# Patient Record
Sex: Male | Born: 1990 | Race: Black or African American | Hispanic: No | Marital: Single | Smoking: Current every day smoker
Health system: Southern US, Community
[De-identification: ages and names within clinical notes are randomized; demographics above are authoritative.]

---
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
## 2010-08-10 ENCOUNTER — Emergency Department (HOSPITAL_COMMUNITY): Payer: Self-pay

## 2010-08-10 ENCOUNTER — Emergency Department (HOSPITAL_COMMUNITY)
Admission: EM | Admit: 2010-08-10 | Discharge: 2010-08-11 | Disposition: A | Discharge status: Home / Self Care | Payer: Self-pay | Attending: Emergency Medicine | Admitting: Emergency Medicine

## 2010-08-10 DIAGNOSIS — M2180 Other specified acquired deformities of unspecified limb: Secondary | ICD-10-CM | POA: Insufficient documentation

## 2010-08-10 DIAGNOSIS — M24419 Recurrent dislocation, unspecified shoulder: Secondary | ICD-10-CM | POA: Insufficient documentation

## 2010-08-10 DIAGNOSIS — R000 Tachycardia, unspecified: Secondary | ICD-10-CM | POA: Insufficient documentation

## 2010-08-10 DIAGNOSIS — M25519 Pain in unspecified shoulder: Secondary | ICD-10-CM | POA: Insufficient documentation

## 2010-08-11 ENCOUNTER — Emergency Department (HOSPITAL_COMMUNITY): Payer: Self-pay

## 2010-09-15 LAB — CBC
HCT: 45.2 % (ref 39.0–52.0)
Hemoglobin: 14.2 g/dL (ref 13.0–17.0)
MCHC: 31.5 g/dL (ref 30.0–36.0)
MCV: 76.9 fL — ABNORMAL LOW (ref 78.0–100.0)
Platelets: 214 K/uL (ref 150–400)
RBC: 5.88 MIL/uL — ABNORMAL HIGH (ref 4.22–5.81)
RDW: 14.3 % (ref 11.5–15.5)
WBC: 8.5 K/uL (ref 4.0–10.5)

## 2010-09-15 LAB — DIFFERENTIAL
Band Neutrophils: 0 % (ref 0–10)
Basophils Absolute: 0 K/uL (ref 0.0–0.1)
Basophils Relative: 0 % (ref 0–1)
Blasts: 0 %
Eosinophils Absolute: 0.1 K/uL (ref 0.0–0.7)
Eosinophils Relative: 1 % (ref 0–5)
Lymphocytes Relative: 13 % (ref 12–46)
Lymphs Abs: 1.1 K/uL (ref 0.7–4.0)
Metamyelocytes Relative: 0 %
Monocytes Absolute: 0.2 K/uL (ref 0.1–1.0)
Monocytes Relative: 2 % — ABNORMAL LOW (ref 3–12)
Myelocytes: 0 %
Neutro Abs: 7.1 K/uL (ref 1.7–7.7)
Neutrophils Relative %: 84 % — ABNORMAL HIGH (ref 43–77)
Promyelocytes Absolute: 0 %
nRBC: 0 /100{WBCs}

## 2010-09-15 LAB — POCT I-STAT, CHEM 8
BUN: 15 mg/dL (ref 6–23)
Calcium, Ion: 1.19 mmol/L (ref 1.12–1.32)
Creatinine, Ser: 1 mg/dL (ref 0.4–1.5)
Glucose, Bld: 88 mg/dL (ref 70–99)
TCO2: 27 mmol/L (ref 0–100)

## 2014-01-07 ENCOUNTER — Encounter (HOSPITAL_COMMUNITY): Payer: Self-pay | Admitting: Emergency Medicine

## 2014-01-07 ENCOUNTER — Emergency Department (HOSPITAL_COMMUNITY)
Admission: EM | Admit: 2014-01-07 | Discharge: 2014-01-07 | Disposition: A | Discharge status: Home / Self Care | Payer: Self-pay | Attending: Emergency Medicine | Admitting: Emergency Medicine

## 2014-01-07 ENCOUNTER — Emergency Department (HOSPITAL_COMMUNITY): Payer: Self-pay

## 2014-01-07 DIAGNOSIS — S8001XA Contusion of right knee, initial encounter: Secondary | ICD-10-CM

## 2014-01-07 DIAGNOSIS — S0180XA Unspecified open wound of other part of head, initial encounter: Secondary | ICD-10-CM | POA: Insufficient documentation

## 2014-01-07 DIAGNOSIS — Y929 Unspecified place or not applicable: Secondary | ICD-10-CM | POA: Insufficient documentation

## 2014-01-07 DIAGNOSIS — Z791 Long term (current) use of non-steroidal anti-inflammatories (NSAID): Secondary | ICD-10-CM | POA: Insufficient documentation

## 2014-01-07 DIAGNOSIS — Y939 Activity, unspecified: Secondary | ICD-10-CM | POA: Insufficient documentation

## 2014-01-07 DIAGNOSIS — W503XXA Accidental bite by another person, initial encounter: Secondary | ICD-10-CM | POA: Insufficient documentation

## 2014-01-07 DIAGNOSIS — F17200 Nicotine dependence, unspecified, uncomplicated: Secondary | ICD-10-CM | POA: Insufficient documentation

## 2014-01-07 DIAGNOSIS — Z23 Encounter for immunization: Secondary | ICD-10-CM | POA: Insufficient documentation

## 2014-01-07 DIAGNOSIS — Z792 Long term (current) use of antibiotics: Secondary | ICD-10-CM | POA: Insufficient documentation

## 2014-01-07 DIAGNOSIS — S8000XA Contusion of unspecified knee, initial encounter: Secondary | ICD-10-CM | POA: Insufficient documentation

## 2014-01-07 MED ORDER — HYDROCODONE-ACETAMINOPHEN 5-325 MG PO TABS
1.0000 | ORAL_TABLET | Freq: Once | ORAL | Status: AC
  Administered 2014-01-07: 1 via ORAL
  Filled 2014-01-07: qty 1

## 2014-01-07 MED ORDER — TETANUS-DIPHTH-ACELL PERTUSSIS 5-2.5-18.5 LF-MCG/0.5 IM SUSP
0.5000 mL | Freq: Once | INTRAMUSCULAR | Status: AC
  Administered 2014-01-07: 0.5 mL via INTRAMUSCULAR
  Filled 2014-01-07: qty 0.5

## 2014-01-07 MED ORDER — AMOXICILLIN-POT CLAVULANATE 875-125 MG PO TABS
1.0000 | ORAL_TABLET | Freq: Once | ORAL | Status: AC
  Administered 2014-01-07: 1 via ORAL
  Filled 2014-01-07: qty 1

## 2014-01-07 MED ORDER — IBUPROFEN 200 MG PO TABS
600.0000 mg | ORAL_TABLET | Freq: Once | ORAL | Status: AC
  Administered 2014-01-07: 600 mg via ORAL
  Filled 2014-01-07: qty 3

## 2014-01-07 MED ORDER — IBUPROFEN 600 MG PO TABS: 600.0000 mg | ORAL_TABLET | Freq: Four times a day (QID) | ORAL | Status: DC

## 2014-01-07 MED ORDER — AMOXICILLIN-POT CLAVULANATE 875-125 MG PO TABS: 1.0000 | ORAL_TABLET | Freq: Two times a day (BID) | ORAL | Status: DC

## 2014-01-07 MED ORDER — MUPIROCIN CALCIUM 2 % EX CREA
TOPICAL_CREAM | Freq: Once | CUTANEOUS | Status: AC
  Administered 2014-01-07: 1 via TOPICAL
  Filled 2014-01-07: qty 15

## 2014-01-07 MED ORDER — HYDROCODONE-ACETAMINOPHEN 5-325 MG PO TABS: 1.0000 | ORAL_TABLET | Freq: Four times a day (QID) | ORAL | Status: AC | PRN
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
## 2014-01-07 NOTE — ED Provider Notes (Signed)
Medical screening examination/treatment/procedure(s) were performed by non-physician practitioner and as supervising physician I was immediately available for consultation/collaboration.   EKG Interpretation None        Vondra Aldredge, MD 01/07/14 0651 

## 2014-01-07 NOTE — ED Provider Notes (Signed)
Medical screening examination/treatment/procedure(s) were performed by non-physician practitioner and as supervising physician I was immediately available for consultation/collaboration.   EKG Interpretation None        Loren Raceravid Aunna Snooks, MD 01/07/14 [phone]
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
## 2014-01-07 NOTE — Discharge Instructions (Signed)
Make sure to take the antibiotic until all the tablets have been consumed  Return for any sign of infection such as pus,  redness, increased swelling, fever

## 2015-12-15 ENCOUNTER — Emergency Department (HOSPITAL_COMMUNITY)
Admission: EM | Admit: 2015-12-15 | Discharge: 2015-12-15 | Disposition: A | Discharge status: Home / Self Care | Payer: Self-pay | Attending: Emergency Medicine | Admitting: Emergency Medicine

## 2015-12-15 ENCOUNTER — Encounter (HOSPITAL_COMMUNITY): Payer: Self-pay | Admitting: Emergency Medicine

## 2015-12-15 DIAGNOSIS — L509 Urticaria, unspecified: Secondary | ICD-10-CM

## 2015-12-15 DIAGNOSIS — F17210 Nicotine dependence, cigarettes, uncomplicated: Secondary | ICD-10-CM | POA: Insufficient documentation

## 2015-12-15 DIAGNOSIS — L500 Allergic urticaria: Secondary | ICD-10-CM | POA: Insufficient documentation

## 2015-12-15 MED ORDER — HYDROCORTISONE 1 % EX CREA
TOPICAL_CREAM | Freq: Once | CUTANEOUS | Status: AC
  Administered 2015-12-15: 1 via TOPICAL
  Filled 2015-12-15: qty 28

## 2015-12-15 MED ORDER — DIPHENHYDRAMINE HCL 25 MG PO CAPS
50.0000 mg | ORAL_CAPSULE | Freq: Once | ORAL | Status: AC
  Administered 2015-12-15: 50 mg via ORAL
  Filled 2015-12-15: qty 2

## 2015-12-15 NOTE — Discharge Instructions (Signed)
Contact Dermatitis Dylan Lynch, use steroid cream 3x per day as needed for itching.  Take benadryl for severe itching.  See a primary care doctor within 3 days for close follow up. If symptoms worsen, come back to the ED immediately. Thank you. Dermatitis is redness, soreness, and swelling (inflammation) of the skin. Contact dermatitis is a reaction to certain substances that touch the skin. You either touched something that irritated your skin, or you have allergies to something you touched.  HOME CARE  Skin Care  Moisturize your skin as needed.  Apply cool compresses to the affected areas.   Try taking a bath with:   Epsom salts. Follow the instructions on the package. You can get these at a pharmacy or grocery store.   Baking soda. Pour a small amount into the bath as told by your doctor.   Colloidal oatmeal. Follow the instructions on the package. You can get this at a pharmacy or grocery store.   Try applying baking soda paste to your skin. Stir water into baking soda until it looks like paste.  Do not scratch your skin.   Bathe less often.  Bathe in lukewarm water. Avoid using hot water.  Medicines  Take or apply over-the-counter and prescription medicines only as told by your doctor.   If you were prescribed an antibiotic medicine, take or apply your antibiotic as told by your doctor. Do not stop taking the antibiotic even if your condition starts to get better. General Instructions  Keep all follow-up visits as told by your doctor. This is important.   Avoid the substance that caused your reaction. If you do not know what caused it, keep a journal to try to track what caused it. Write down:   What you eat.   What cosmetic products you use.   What you drink.   What you wear in the affected area. This includes jewelry.   If you were given a bandage (dressing), take care of it as told by your doctor. This includes when to change and remove it.  GET  HELP IF:   You do not get better with treatment.   Your condition gets worse.   You have signs of infection such as:  Swelling.  Tenderness.  Redness.  Soreness.  Warmth.   You have a fever.   You have new symptoms.  GET HELP RIGHT AWAY IF:   You have a very bad headache.  You have neck pain.  Your neck is stiff.   You throw up (vomit).   You feel very sleepy.   You see red streaks coming from the affected area.   Your bone or joint underneath the affected area becomes painful after the skin has healed.   The affected area turns darker.   You have trouble breathing.    This information is not intended to replace advice given to you by your health care provider. Make sure you discuss any questions you have with your health care provider.   Document Released: 03/15/2009 Document Revised: 02/06/2015 Document Reviewed: 10/03/2014 Elsevier Interactive Patient Education [employer]

## 2015-12-15 NOTE — ED Notes (Signed)
Pt states he noticed that he has some bug bites on bilateral arms he thought it was mosquito bites, but today he noticed that is spread all around his arms.
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
## 2016-12-26 ENCOUNTER — Encounter (HOSPITAL_COMMUNITY): Payer: Self-pay

## 2016-12-26 ENCOUNTER — Emergency Department (HOSPITAL_COMMUNITY)
Admission: EM | Admit: 2016-12-26 | Discharge: 2016-12-26 | Disposition: A | Discharge status: Home / Self Care | Payer: Self-pay | Attending: Emergency Medicine | Admitting: Emergency Medicine

## 2016-12-26 DIAGNOSIS — R21 Rash and other nonspecific skin eruption: Secondary | ICD-10-CM | POA: Insufficient documentation

## 2016-12-26 DIAGNOSIS — Z79899 Other long term (current) drug therapy: Secondary | ICD-10-CM | POA: Insufficient documentation

## 2016-12-26 DIAGNOSIS — F17210 Nicotine dependence, cigarettes, uncomplicated: Secondary | ICD-10-CM | POA: Insufficient documentation

## 2016-12-26 MED ORDER — TRIAMCINOLONE ACETONIDE 0.1 % EX CREA: 1.0000 "application " | TOPICAL_CREAM | Freq: Two times a day (BID) | CUTANEOUS | 0 refills | Status: AC

## 2016-12-26 NOTE — Discharge Instructions (Addendum)
Use steroid cream twice daily

## 2016-12-26 NOTE — ED Provider Notes (Signed)
[facility]-EMERGENCY DEPT Provider Note   CSN: [id number] Arrival date & time: 12/26/16  1417   By signing my name below, I, Clarisse GougeXavier Herndon, attest that this documentation has been prepared under the direction and in the presence of Terance HartKelly Gekas, PA-C. Electronically signed, Clarisse GougeXavier Herndon, ED Scribe. 12/26/16. 4:45 PM.  History   Chief Complaint Chief Complaint  Patient presents with  . Insect Bite    The history is provided by the patient and medical records. The history is limited by the condition of the patient. No language interpreter was used.    Dylan Lynch is a 26 y.o. male presenting to the Emergency Department concerning multiple quarter sized, red plaques to the R upper arm and R chest. Associated itchiness. Pt believes this is from a bug bite though he did not visualize one. No sick contacts. No other complaints at this time.   LEVEL 5 CAVEAT: HPI and ROS limited due to extreme drowsiness    History reviewed. No pertinent past medical history.  There are no active problems to display for this patient.   History reviewed. No pertinent surgical history.     Home Medications    Prior to Admission medications   Medication Sig Start Date End Date Taking? Authorizing Provider  amoxicillin-clavulanate (AUGMENTIN) 875-125 MG per tablet Take 1 tablet by mouth 2 (two) times daily. 01/07/14   Earley FavorSchulz, Gail, NP  HYDROcodone-acetaminophen (NORCO/VICODIN) 5-325 MG per tablet Take 1 tablet by mouth every 6 (six) hours as needed for moderate pain. 01/07/14   Earley FavorSchulz, Gail, NP  ibuprofen (ADVIL,MOTRIN) 600 MG tablet Take 1 tablet (600 mg total) by mouth 4 (four) times daily. 01/07/14   Earley FavorSchulz, Gail, NP    Family History No family history on file.  Social History Social History  Substance Use Topics  . Smoking status: Current Every Day Smoker    Packs/day: 1.00    Types: Cigarettes  . Smokeless tobacco: Never Used  . Alcohol use Yes     Allergies   Patient has no known  allergies.   Review of Systems Review of Systems  Unable to perform ROS: Other     Physical Exam Updated Vital Signs BP 103/66 (BP Location: Left Arm)   Pulse 99   Temp 97.7 F (36.5 C) (Oral)   Resp 14   Ht 5\' 11"  (1.803 m)   Wt 180 lb (81.6 kg)   SpO2 95%   BMI 25.10 kg/m   Physical Exam  Constitutional: Vital signs are normal. He appears well-developed and well-nourished. He is sleeping. No distress.  Pt somnolent and difficult to arouse  HENT:  Head: Normocephalic and atraumatic.  Neck: Normal range of motion.  Cardiovascular: Normal rate.   Pulmonary/Chest: Effort normal. No respiratory distress.  Abdominal: He exhibits no distension.  Musculoskeletal: Normal range of motion.  Skin: Skin is warm and dry.  Several quarter sized erythematous raised plaques to the R upper arm and R upper chest wall  Psychiatric: He has a normal mood and affect. His behavior is normal.  Nursing note and vitals reviewed.    ED Treatments / Results  DIAGNOSTIC STUDIES: Oxygen Saturation is 95% on RA, adequate by my interpretation.    COORDINATION OF CARE: 4:44 PM-Discussed next steps with pt. Pt verbalized understanding and is agreeable with the plan. Will Rx medications. Pt prepared for d/c, advised of symptomatic care at home and return precautions.    Labs (all labs ordered are listed, but only abnormal results are displayed) Labs Reviewed -  No data to display  EKG  EKG Interpretation None       Radiology No results found.  Procedures Procedures (including critical care time)  Medications Ordered in ED Medications - No data to display   Initial Impression / Assessment and Plan / ED Course  I have reviewed the triage vital signs and the nursing notes.  Pertinent labs & imaging results that were available during my care of the patient were reviewed by me and considered in my medical decision making (see chart for details).  26 year old male with possible insect  bites. He is very sleepy and I was unable to get a adequate history from him. Advised steroid cream twice a day. Security was called to arouse pt and escort him from the ED.  Final Clinical Impressions(s) / ED Diagnoses   Final diagnoses:  Rash and nonspecific skin eruption    New Prescriptions New Prescriptions   No medications on file   I personally performed the services described in this documentation, which was scribed in my presence. The recorded information has been reviewed and is accurate.    Bethel BornGekas, Kelly Marie, PA-C 12/26/16 1743    Margarita Grizzleay, Danielle, MD 12/27/16 [phone]

## 2016-12-26 NOTE — ED Triage Notes (Signed)
Pt refused to open eyes and talk with PA,the registration staff and RN. Security called to escort Pt from the room 26 to front lobby. Pt A/O and ambulatory with security.

## 2016-12-26 NOTE — ED Notes (Signed)
Declined W/C at D/C and was escorted to lobby by RN. 

## 2016-12-26 NOTE — ED Triage Notes (Signed)
Per Pt, Pt is coming from home with insect bites noted to his right and left arms that he noticed last night.

## 2017-01-04 ENCOUNTER — Emergency Department (HOSPITAL_COMMUNITY): Payer: Self-pay

## 2017-01-04 ENCOUNTER — Emergency Department (HOSPITAL_COMMUNITY)
Admission: EM | Admit: 2017-01-04 | Discharge: 2017-01-04 | Disposition: A | Discharge status: Home / Self Care | Payer: Self-pay | Attending: Emergency Medicine | Admitting: Emergency Medicine

## 2017-01-04 ENCOUNTER — Encounter (HOSPITAL_COMMUNITY): Payer: Self-pay | Admitting: Emergency Medicine

## 2017-01-04 DIAGNOSIS — F17210 Nicotine dependence, cigarettes, uncomplicated: Secondary | ICD-10-CM | POA: Insufficient documentation

## 2017-01-04 DIAGNOSIS — Y9372 Activity, wrestling: Secondary | ICD-10-CM | POA: Insufficient documentation

## 2017-01-04 DIAGNOSIS — Y929 Unspecified place or not applicable: Secondary | ICD-10-CM | POA: Insufficient documentation

## 2017-01-04 DIAGNOSIS — S43004A Unspecified dislocation of right shoulder joint, initial encounter: Secondary | ICD-10-CM | POA: Insufficient documentation

## 2017-01-04 DIAGNOSIS — X509XXA Other and unspecified overexertion or strenuous movements or postures, initial encounter: Secondary | ICD-10-CM | POA: Insufficient documentation

## 2017-01-04 DIAGNOSIS — Y999 Unspecified external cause status: Secondary | ICD-10-CM | POA: Insufficient documentation

## 2017-01-04 DIAGNOSIS — Z79899 Other long term (current) drug therapy: Secondary | ICD-10-CM | POA: Insufficient documentation

## 2017-01-04 MED ORDER — IBUPROFEN 600 MG PO TABS: 600.0000 mg | ORAL_TABLET | Freq: Four times a day (QID) | ORAL | 0 refills | Status: AC

## 2017-01-04 MED ORDER — PROPOFOL 10 MG/ML IV BOLUS: 0.5000 mg/kg | Freq: Once | INTRAVENOUS | Status: DC

## 2017-01-04 MED ORDER — SODIUM CHLORIDE 0.9 % IV SOLN: Freq: Once | INTRAVENOUS | Status: DC

## 2017-01-04 NOTE — ED Provider Notes (Signed)
[facility] Provider Note   CSN: [id number] Arrival date & time: 01/04/17  0142    History   Chief Complaint Chief Complaint  Patient presents with  . Shoulder Pain    possible dislocation right arm    HPI Dylan Lynch is a 26 y.o. male.  26 year old male with a history of right shoulder dislocation presents to the emergency department for shoulder pain. He states that he was wrestling when "I swung my arm too hard and I knew it was out". Patient reports a history of spontaneous shoulder reduction. He has been unable to reduce his shoulder prior to arrival. He reports severe pain which is aggravated with movement. No medications taken prior to arrival for symptoms. Patient denies associated numbness or paresthesias.      History reviewed. No pertinent past medical history.  There are no active problems to display for this patient.   History reviewed. No pertinent surgical history.    Home Medications    Prior to Admission medications   Medication Sig Start Date End Date Taking? Authorizing Provider  amoxicillin-clavulanate (AUGMENTIN) 875-125 MG per tablet Take 1 tablet by mouth 2 (two) times daily. 01/07/14   Earley FavorSchulz, Gail, NP  HYDROcodone-acetaminophen (NORCO/VICODIN) 5-325 MG per tablet Take 1 tablet by mouth every 6 (six) hours as needed for moderate pain. 01/07/14   Earley FavorSchulz, Gail, NP  ibuprofen (ADVIL,MOTRIN) 600 MG tablet Take 1 tablet (600 mg total) by mouth 4 (four) times daily. 01/04/17   Antony MaduraHumes, Mardi Cannady, PA-C  triamcinolone cream (KENALOG) 0.1 % Apply 1 application topically 2 (two) times daily. 12/26/16   Bethel BornGekas, Anabel Lykins Marie, PA-C    Family History No family history on file.  Social History Social History  Substance Use Topics  . Smoking status: Current Every Day Smoker    Packs/day: 1.00    Types: Cigarettes  . Smokeless tobacco: Never Used  . Alcohol use Yes     Allergies   Patient has no known allergies.   Review of Systems Review of  Systems Ten systems reviewed and are negative for acute change, except as noted in the HPI.    Physical Exam Updated Vital Signs BP 129/77 (BP Location: Right Arm)   Pulse 93   Temp 97.8 F (36.6 C) (Oral)   Resp 18   Ht 5\' 11"  (1.803 m)   Wt 81.6 kg (180 lb)   SpO2 94%   BMI 25.10 kg/m   Physical Exam  Constitutional: He is oriented to person, place, and time. He appears well-developed and well-nourished. No distress.  HENT:  Head: Normocephalic and atraumatic.  Eyes: Conjunctivae and EOM are normal. No scleral icterus.  Neck: Normal range of motion.  Cardiovascular: Regular rhythm and intact distal pulses.   Tachycardia. Distal radial pulse 2+ in the right upper extremity.  Pulmonary/Chest: Effort normal. No respiratory distress.  Respirations even and unlabored.  Musculoskeletal:       Right shoulder: He exhibits decreased range of motion, tenderness, deformity and pain. He exhibits no effusion and no crepitus.  Neurological: He is alert and oriented to person, place, and time. He exhibits normal muscle tone. Coordination normal.  Sensation to light touch intact in bilateral upper extremities. Grip strength 5/5 on the right.  Skin: Skin is warm and dry. No rash noted. He is not diaphoretic. No erythema. No pallor.  Psychiatric: He has a normal mood and affect. His behavior is normal.  Nursing note and vitals reviewed.    ED Treatments / Results  Labs (all  labs ordered are listed, but only abnormal results are displayed) Labs Reviewed - No data to display  EKG  EKG Interpretation None       Radiology Dg Shoulder Right  Result Date: 01/04/2017 CLINICAL DATA:  Acute onset of right shoulder pain. Initial encounter. EXAM: RIGHT SHOULDER - 2+ VIEW COMPARISON:  Right shoulder radiographs performed 08/11/2010 FINDINGS: There is anterior-inferior dislocation of the right humeral head, with associated Hill-Sachs deformity. The humeral head is lodged against the inferior  glenoid. The right acromioclavicular joint is unremarkable. The right lung appears clear. No definite osseous Bankart lesion is seen. IMPRESSION: Anterior-inferior dislocation of the right humeral head, with associated Hill-Sachs deformity. Humeral head lodged against the inferior glenoid. Electronically Signed   By: Roanna Raider M.D.   On: 01/04/2017 02:25   Dg Shoulder Right Portable  Result Date: 01/04/2017 CLINICAL DATA:  Post reduction EXAM: PORTABLE RIGHT SHOULDER COMPARISON:  01/04/2017 at 0213 hours FINDINGS: Reduction of anterior inferior humeral head dislocation with large Hill-Sachs deformity is noted. No bony Bankart lesion is seen. AC joint is maintained. IMPRESSION: Reduction of previous anterior humeral head dislocation. Large Hill-Sachs deformity of the humeral head superolaterally. Electronically Signed   By: Tollie Eth M.D.   On: 01/04/2017 03:41    Procedures Reduction of dislocation Date/Time: 01/04/2017 3:31 AM Performed by: Antony Madura Authorized by: Antony Madura  Consent: The procedure was performed in an emergent situation. Verbal consent obtained. Risks and benefits: risks, benefits and alternatives were discussed Consent given by: patient Patient understanding: patient states understanding of the procedure being performed Patient consent: the patient's understanding of the procedure matches consent given Procedure consent: procedure consent matches procedure scheduled Relevant documents: relevant documents present and verified Test results: test results available and properly labeled Imaging studies: imaging studies available Required items: required blood products, implants, devices, and special equipment available Patient identity confirmed: verbally with patient and arm band Time out: Immediately prior to procedure a "time out" was called to verify the correct patient, procedure, equipment, support staff and site/side marked as required.  Sedation: Patient  sedated: no Comments: R shoulder dislocation reduced successfully with Stimson's technique. Patient tolerated well with no immediate complications. No sedation required.    (including critical care time)  Medications Ordered in ED Medications - No data to display   Initial Impression / Assessment and Plan / ED Course  I have reviewed the triage vital signs and the nursing notes.  Pertinent labs & imaging results that were available during my care of the patient were reviewed by me and considered in my medical decision making (see chart for details).      25 year old male with a history of right shoulder dislocation presents for right shoulder pain consistent with acute dislocation. He was noted to be neurovascularly intact on exam. Shoulder was able to be reduced successfully with Stimson technique. This resolved the patient's pain. He was placed in a shoulder immobilizer. Will refer to orthopedic surgery for follow-up as needed as the patient would likely benefit from physical therapy to prevent future dislocations. Return precautions discussed and provided. Patient discharged in stable condition with no unaddressed concerns.   Final Clinical Impressions(s) / ED Diagnoses   Final diagnoses:  Dislocation of right shoulder joint, initial encounter    New Prescriptions Current Discharge Medication List       Antony Madura, PA-C 01/04/17 0348    Zadie Rhine, MD 01/04/17 [phone]

## 2017-01-04 NOTE — ED Notes (Signed)
ED Provider at bedside. 

## 2017-01-04 NOTE — ED Triage Notes (Signed)
Pt c/o 10/10 shoulder pain, got injured while playing sport, prior hx of shoulder dislocation, deformity noticed.

## 2017-01-04 NOTE — ED Notes (Signed)
PT states understanding of care given, follow up care, and medication prescribed. PT ambulated from ED to car with a steady gait. 

## 2017-01-04 NOTE — ED Notes (Signed)
Pt reports R shoulder pain, obvious deformity noted. Pt states "I swung my arm too hard and I knew it was out". Pulses 2+, sensation and mobility intact.

## 2017-09-24 ENCOUNTER — Encounter (HOSPITAL_COMMUNITY): Payer: Self-pay | Admitting: Emergency Medicine

## 2017-09-24 ENCOUNTER — Ambulatory Visit (HOSPITAL_COMMUNITY)
Admission: EM | Admit: 2017-09-24 | Discharge: 2017-09-24 | Disposition: A | Discharge status: Home / Self Care | Payer: Self-pay | Attending: Family Medicine | Admitting: Family Medicine

## 2017-09-24 DIAGNOSIS — H6123 Impacted cerumen, bilateral: Secondary | ICD-10-CM

## 2017-09-24 DIAGNOSIS — H66002 Acute suppurative otitis media without spontaneous rupture of ear drum, left ear: Secondary | ICD-10-CM

## 2017-09-24 MED ORDER — AMOXICILLIN-POT CLAVULANATE 875-125 MG PO TABS: 1.0000 | ORAL_TABLET | Freq: Two times a day (BID) | ORAL | 0 refills | Status: AC

## 2017-09-24 NOTE — ED Provider Notes (Signed)
[facility]    CSN: [id number] Arrival date & time: 09/24/17  1756     History   Chief Complaint Chief Complaint  Patient presents with  . Otalgia    HPI Dylan Lynch is a 27 y.o. male.   Complains of intermittent bilateral ear pain left worse than right for the past about a month.  He states the pain in his right ear has been intermittent for the past month.  However, his left ear pain has been constant and achy in character for the past three days.  He denies a precipitating event, such as swimming or wearing ear plugs.  He has tried peroxide without relief.  He denies aggravating factors.  He denies similar symptoms in the past.       History reviewed. No pertinent past medical history.  There are no active problems to display for this patient.   History reviewed. No pertinent surgical history.     Home Medications    Prior to Admission medications   Medication Sig Start Date End Date Taking? Authorizing Provider  amoxicillin-clavulanate (AUGMENTIN) 875-125 MG tablet Take 1 tablet by mouth every 12 (twelve) hours for 10 days. 09/24/17 10/04/17  Rennis Harding, PA-C  HYDROcodone-acetaminophen (NORCO/VICODIN) 5-325 MG per tablet Take 1 tablet by mouth every 6 (six) hours as needed for moderate pain. Patient not taking: Reported on 09/24/2017 01/07/14   Earley Favor, NP  ibuprofen (ADVIL,MOTRIN) 600 MG tablet Take 1 tablet (600 mg total) by mouth 4 (four) times daily. 01/04/17   Antony Madura, PA-C  triamcinolone cream (KENALOG) 0.1 % Apply 1 application topically 2 (two) times daily. 12/26/16   Bethel Born, PA-C    Family History No family history on file.  Social History Social History   Tobacco Use  . Smoking status: Current Every Day Smoker    Packs/day: 1.00    Types: Cigarettes  . Smokeless tobacco: Never Used  Substance Use Topics  . Alcohol use: Yes  . Drug use: No     Allergies   Patient has no known allergies.   Review of  Systems Review of Systems  Constitutional: Negative for chills, fatigue and fever.  HENT: Positive for ear pain and hearing loss. Negative for ear discharge, rhinorrhea, sinus pressure, sinus pain, sneezing, sore throat and trouble swallowing.   Respiratory: Negative for cough and shortness of breath.   Cardiovascular: Negative for chest pain.  Gastrointestinal: Negative for abdominal pain.     Physical Exam Triage Vital Signs ED Triage Vitals [09/24/17 1830]  Enc Vitals Group     BP 127/81     Pulse Rate 80     Resp 18     Temp 97.8 F (36.6 C)     Temp src      SpO2 100 %     Weight      Height      Head Circumference      Peak Flow      Pain Score      Pain Loc      Pain Edu?      Excl. in GC?    No data found.  Updated Vital Signs BP 127/81   Pulse 80   Temp 97.8 F (36.6 C)   Resp 18   SpO2 100%   Physical Exam  Constitutional: He is oriented to person, place, and time. He appears well-developed and well-nourished. No distress.  HENT:  Head: Normocephalic and atraumatic.  Nose: Nose normal.  Mouth/Throat: Oropharynx  is clear and moist. No oropharyngeal exudate.  LT Ear EAC clear TM visible with purulent fluid Mildly erythematous Mild tenderness with tragal manipulation.  Negative mastoid tenderness  RT ear EAC partial obstruction by cerumen TM pearly grey with cone of light No tragal or mastoid tenderness.   Eyes: Pupils are equal, round, and reactive to light. EOM are normal. No scleral icterus.  Neck: Normal range of motion. Neck supple.  Mild right side preauricular lymphadenopathy   Cardiovascular: Normal rate, regular rhythm and normal heart sounds. Exam reveals no friction rub.  No murmur heard. Radial pulse 2+ bilaterally    Pulmonary/Chest: Effort normal and breath sounds normal. No stridor. No respiratory distress. He has no wheezes. He has no rales.  Musculoskeletal:  Ambulates from chair to exam table without difficulty   Lymphadenopathy:    He has no cervical adenopathy.  Neurological: He is alert and oriented to person, place, and time.  Skin: Skin is warm and dry. Capillary refill takes less than 2 seconds. He is not diaphoretic.  Psychiatric: He has a normal mood and affect. His behavior is normal. Judgment and thought content normal.  Vitals reviewed.    UC Treatments / Results  Labs (all labs ordered are listed, but only abnormal results are displayed) Labs Reviewed - No data to display  EKG None Radiology No results found.  Procedures Procedures (including critical care time)  Medications Ordered in UC Medications - No data to display   Initial Impression / Assessment and Plan / UC Course  I have reviewed the triage vital signs and the nursing notes.  Pertinent labs & imaging results that were available during my care of the patient were reviewed by me and considered in my medical decision making (see chart for details).    Patient's hx and PE of LT ear pain consistent with OM.  On exam RT ear was obstructed by cerumen.  Ear lavage performed and patient reports relief in symptoms.  RT TM visible with cone of light.  Augementin prescribed.  Will follow up with PCP if symptoms persists.  Return and ER precautions given.    Final Clinical Impressions(s) / UC Diagnoses   Final diagnoses:  Acute suppurative otitis media of left ear without spontaneous rupture of tympanic membrane, recurrence not specified  Bilateral impacted cerumen    ED Discharge Orders        Ordered    amoxicillin-clavulanate (AUGMENTIN) 875-125 MG tablet  Every 12 hours     09/24/17 1900       Controlled Substance Prescriptions [place] Controlled Substance Registry consulted? Not Applicable   Rennis HardingWurst, Janeann Paisley, [place] JerseyPA-C 09/24/17 1913

## 2017-09-24 NOTE — ED Triage Notes (Signed)
Pt c/o bilateral ear pain, states "they both feel clogged"

## 2017-09-24 NOTE — Discharge Instructions (Signed)
Ear wax lavage performed in office today Get plenty of rest and drink fluids Take antibiotic as prescribed and to completion Follow up with PCP if symptoms persists Return or go to ER if you have any new or worsening symptoms

## 2018-01-08 IMAGING — CR DG SHOULDER 2+V*R*
2 series · 2 of 2 positions shown · non-contrast
Comparison: Right shoulder radiographs performed 08/11/2010

CLINICAL DATA: Acute onset of right shoulder pain. Initial
encounter.

EXAM:
RIGHT SHOULDER - 2+ VIEW

[shoulder grashey]
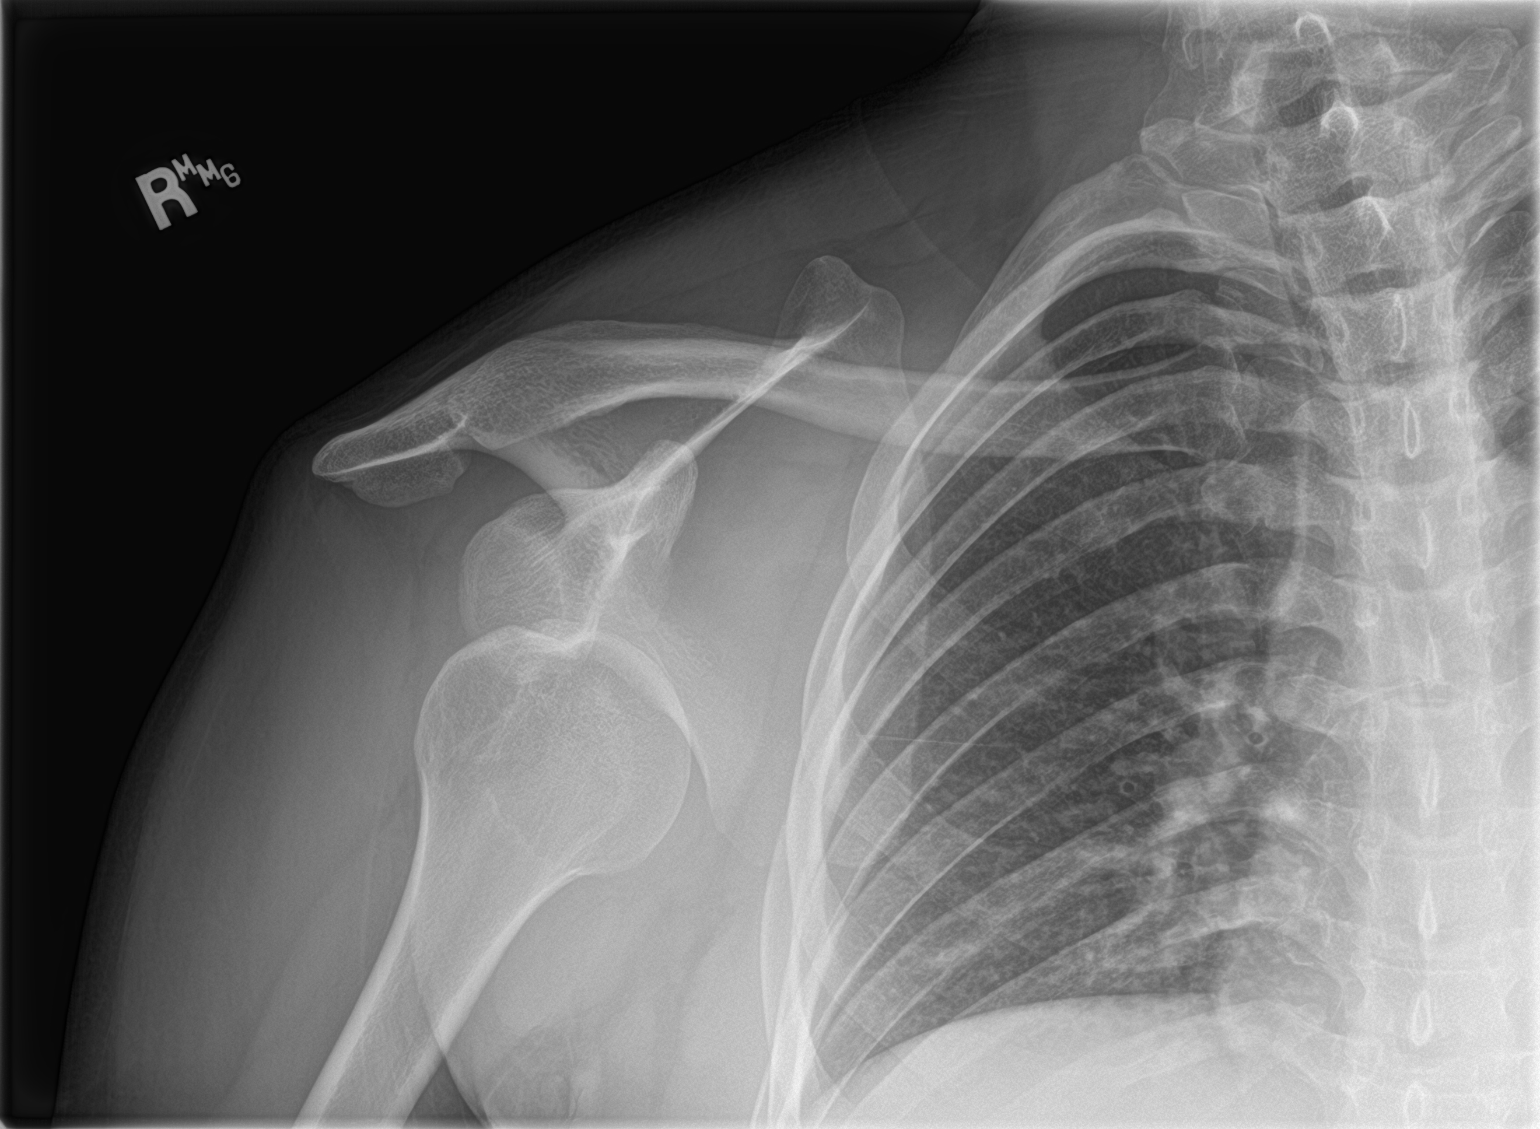

[shoulder y view]
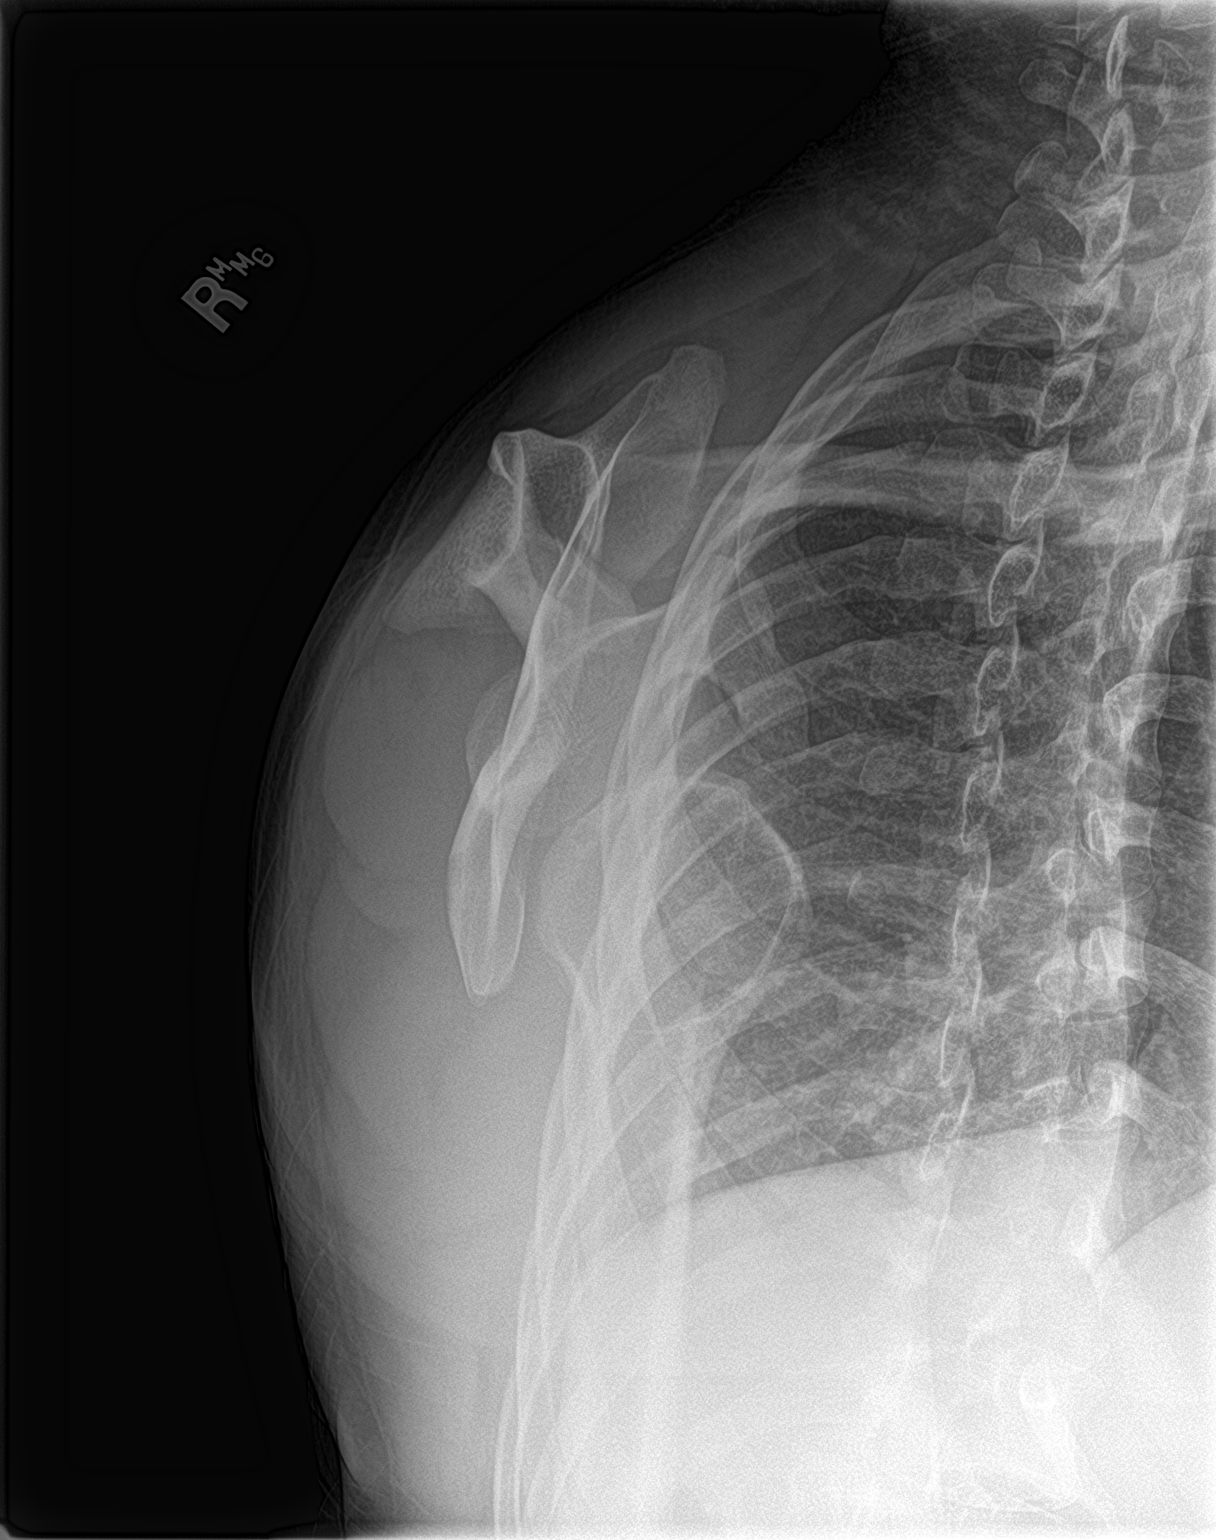

[2 of 2 positions shown; findings below may reference images not displayed]

FINDINGS: There is anterior-inferior dislocation of the right humeral head,
with associated Hill-Sachs deformity. The humeral head is lodged
against the inferior glenoid. The right acromioclavicular joint is
unremarkable. The right lung appears clear.

No definite osseous Bankart lesion is seen.
IMPRESSION: Anterior-inferior dislocation of the right humeral head, with
associated Hill-Sachs deformity. Humeral head lodged against the
inferior glenoid.

## 2019-07-03 ENCOUNTER — Emergency Department (HOSPITAL_BASED_OUTPATIENT_CLINIC_OR_DEPARTMENT_OTHER)
Admission: EM | Admit: 2019-07-03 | Discharge: 2019-07-03 | Disposition: A | Discharge status: Home / Self Care | Payer: Self-pay | Attending: Emergency Medicine | Admitting: Emergency Medicine

## 2019-07-03 ENCOUNTER — Other Ambulatory Visit: Payer: Self-pay

## 2019-07-03 ENCOUNTER — Encounter (HOSPITAL_BASED_OUTPATIENT_CLINIC_OR_DEPARTMENT_OTHER): Payer: Self-pay | Admitting: *Deleted

## 2019-07-03 DIAGNOSIS — Z79899 Other long term (current) drug therapy: Secondary | ICD-10-CM | POA: Insufficient documentation

## 2019-07-03 DIAGNOSIS — F17210 Nicotine dependence, cigarettes, uncomplicated: Secondary | ICD-10-CM | POA: Insufficient documentation

## 2019-07-03 DIAGNOSIS — H6123 Impacted cerumen, bilateral: Secondary | ICD-10-CM | POA: Insufficient documentation

## 2019-07-03 NOTE — ED Triage Notes (Signed)
Left ear pain x 2 night  Also feels stopped up

## 2019-07-03 NOTE — Discharge Instructions (Addendum)
Please read instructions below. You can use hydrogen peroxide and water rinses in your ears, should this return.  Use equal parts hydrogen peroxide and water. Follow-up with your primary care provider.  

## 2019-07-03 NOTE — ED Provider Notes (Signed)
[facility] EMERGENCY DEPARTMENT Provider Note   CSN: [id number] Arrival date & time: 07/03/19  1436     History Chief Complaint  Patient presents with  . Otalgia    Dylan Lynch is a 29 y.o. male presenting to the emergency department with left ear pain for 2 days.  He states he has had intermittent issues with both ears and earwax buildup.  He usually treats with water and hydrogen peroxide flushes.  Treating his ear symptoms for this episode, he only used water.  It did not provide significant relief.  He has some discomfort in his ear and muffled hearing.  No fevers.  The history is provided by the patient.       History reviewed. No pertinent past medical history.  There are no problems to display for this patient.   History reviewed. No pertinent surgical history.     No family history on file.  Social History   Tobacco Use  . Smoking status: Current Every Day Smoker    Packs/day: 1.00    Types: Cigarettes  . Smokeless tobacco: Never Used  Substance Use Topics  . Alcohol use: Yes  . Drug use: No    Home Medications Prior to Admission medications   Medication Sig Start Date End Date Taking? Authorizing Provider  HYDROcodone-acetaminophen (NORCO/VICODIN) 5-325 MG per tablet Take 1 tablet by mouth every 6 (six) hours as needed for moderate pain. Patient not taking: Reported on 09/24/2017 01/07/14   Earley Favor, NP  ibuprofen (ADVIL,MOTRIN) 600 MG tablet Take 1 tablet (600 mg total) by mouth 4 (four) times daily. 01/04/17   Antony Madura, PA-C  triamcinolone cream (KENALOG) 0.1 % Apply 1 application topically 2 (two) times daily. 12/26/16   Bethel Born, PA-C    Allergies    Patient has no known allergies.  Review of Systems   Review of Systems  Constitutional: Negative for fever.  HENT: Positive for ear pain.     Physical Exam Updated Vital Signs BP (!) 142/91 (BP Location: Right Arm)   Pulse 93   Temp 98.1 F (36.7 C) (Oral)   Resp  16   Ht 5\' 11"  (1.803 m)   Wt 113.4 kg   SpO2 98%   BMI 34.87 kg/m   Physical Exam Vitals and nursing note reviewed.  Constitutional:      General: He is not in acute distress.    Appearance: He is well-developed.  HENT:     Head: Normocephalic and atraumatic.     Right Ear: There is impacted cerumen.     Left Ear: There is impacted cerumen.  Eyes:     Conjunctiva/sclera: Conjunctivae normal.  Cardiovascular:     Rate and Rhythm: Normal rate.  Pulmonary:     Effort: Pulmonary effort is normal.  Neurological:     Mental Status: He is alert.  Psychiatric:        Mood and Affect: Mood normal.        Behavior: Behavior normal.     ED Results / Procedures / Treatments   Labs (all labs ordered are listed, but only abnormal results are displayed) Labs Reviewed - No data to display  EKG None  Radiology No results found.  Procedures Procedures (including critical care time)  Medications Ordered in ED Medications - No data to display  ED Course  I have reviewed the triage vital signs and the nursing notes.  Pertinent labs & imaging results that were available during my care of the  patient were reviewed by me and considered in my medical decision making (see chart for details).    MDM Rules/Calculators/A&P                      Patient with bilateral cerumen impaction, history of the same.  Ears were irrigated at bedside by RN, patient experiencing of acute improvement following irrigation.  He states his hearing returned to normal and his pain is resolved.  Instructed 1:1 hydrogen peroxide and water rinses as needed.  PCP follow-up.  Safe for discharge.   Final Clinical Impression(s) / ED Diagnoses Final diagnoses:  Bilateral impacted cerumen    Rx / DC Orders ED Discharge Orders    None       Yannely Kintzel, [place] N, PA-C 07/03/19 1639    Veryl Speak, MD 07/03/19 2043

## 2021-07-16 ENCOUNTER — Emergency Department (HOSPITAL_BASED_OUTPATIENT_CLINIC_OR_DEPARTMENT_OTHER)
Admission: EM | Admit: 2021-07-16 | Discharge: 2021-07-16 | Disposition: A | Discharge status: Home / Self Care | Payer: Self-pay | Attending: Emergency Medicine | Admitting: Emergency Medicine

## 2021-07-16 ENCOUNTER — Encounter (HOSPITAL_BASED_OUTPATIENT_CLINIC_OR_DEPARTMENT_OTHER): Payer: Self-pay

## 2021-07-16 ENCOUNTER — Other Ambulatory Visit: Payer: Self-pay

## 2021-07-16 DIAGNOSIS — F17210 Nicotine dependence, cigarettes, uncomplicated: Secondary | ICD-10-CM | POA: Insufficient documentation

## 2021-07-16 DIAGNOSIS — Z20822 Contact with and (suspected) exposure to covid-19: Secondary | ICD-10-CM | POA: Insufficient documentation

## 2021-07-16 DIAGNOSIS — R030 Elevated blood-pressure reading, without diagnosis of hypertension: Secondary | ICD-10-CM | POA: Insufficient documentation

## 2021-07-16 DIAGNOSIS — M791 Myalgia, unspecified site: Secondary | ICD-10-CM | POA: Insufficient documentation

## 2021-07-16 DIAGNOSIS — R059 Cough, unspecified: Secondary | ICD-10-CM | POA: Insufficient documentation

## 2021-07-16 DIAGNOSIS — R509 Fever, unspecified: Secondary | ICD-10-CM | POA: Insufficient documentation

## 2021-07-16 DIAGNOSIS — R051 Acute cough: Secondary | ICD-10-CM

## 2021-07-16 DIAGNOSIS — Z79899 Other long term (current) drug therapy: Secondary | ICD-10-CM | POA: Insufficient documentation

## 2021-07-16 LAB — RESP PANEL BY RT-PCR (FLU A&B, COVID) ARPGX2
Influenza A by PCR: NEGATIVE
Influenza B by PCR: NEGATIVE
SARS Coronavirus 2 by RT PCR: NEGATIVE

## 2021-07-16 MED ORDER — BENZONATATE 100 MG PO CAPS: 100.0000 mg | ORAL_CAPSULE | Freq: Three times a day (TID) | ORAL | 0 refills | Status: AC

## 2021-07-16 MED ORDER — ACETAMINOPHEN 325 MG PO TABS
650.0000 mg | ORAL_TABLET | Freq: Once | ORAL | Status: AC
  Administered 2021-07-16: 650 mg via ORAL
  Filled 2021-07-16: qty 2

## 2021-07-16 MED ORDER — BENZONATATE 100 MG PO CAPS
100.0000 mg | ORAL_CAPSULE | Freq: Once | ORAL | Status: AC
  Administered 2021-07-16: 100 mg via ORAL
  Filled 2021-07-16: qty 1

## 2021-07-16 NOTE — Discharge Instructions (Addendum)
Please continue taking DayQuil and NyQuil as needed for symptoms.  You can also use Tessalon Perles for cough.  I would like for you to call [facility] to establish primary care.  Please return to the emergency department for worsening symptoms.

## 2021-07-16 NOTE — ED Provider Notes (Signed)
[facility] EMERGENCY DEPARTMENT Provider Note   CSN: [id number] Arrival date & time: 07/16/21  1440     History Chief Complaint  Patient presents with   Cough    Dylan Lynch is a 31 y.o. male with no significant past medical history who presents to the emergency department today for 3-day history of cough, congestion, watery discharge from the eyes, general myalgias, and subjective fever.  Patient states has been taking DayQuil and NyQuil at home for the last several days with little improvement.  He denies any chest tightness or shortness of breath.  No diarrhea or vomiting.  Last took a dose of DayQuil around 6:30 AM this morning.   Cough     Home Medications Prior to Admission medications   Medication Sig Start Date End Date Taking? Authorizing Provider  benzonatate (TESSALON) 100 MG capsule Take 1 capsule (100 mg total) by mouth every 8 (eight) hours. 07/16/21  Yes Meredeth Ide, Claxton Levitz M, PA-C  HYDROcodone-acetaminophen (NORCO/VICODIN) 5-325 MG per tablet Take 1 tablet by mouth every 6 (six) hours as needed for moderate pain. Patient not taking: Reported on 09/24/2017 01/07/14   Earley Favor, NP  ibuprofen (ADVIL,MOTRIN) 600 MG tablet Take 1 tablet (600 mg total) by mouth 4 (four) times daily. 01/04/17   Antony Madura, PA-C  triamcinolone cream (KENALOG) 0.1 % Apply 1 application topically 2 (two) times daily. 12/26/16   Bethel Born, PA-C      Allergies    Patient has no known allergies.    Review of Systems   Review of Systems  Respiratory:  Positive for cough.   All other systems reviewed and are negative.  Physical Exam Updated Vital Signs BP 117/89 (BP Location: Right Arm)    Pulse 75    Temp 98.6 F (37 C) (Oral)    Resp 18    Ht 6' (1.829 m)    Wt 108 kg    SpO2 98%    BMI 32.28 kg/m  Physical Exam Vitals and nursing note reviewed.  Constitutional:      Appearance: Normal appearance.  HENT:     Head: Normocephalic and atraumatic.  Eyes:      General:        Right eye: No discharge.        Left eye: No discharge.     Conjunctiva/sclera: Conjunctivae normal.  Cardiovascular:     Comments: Tachycardic.  No evidence of murmurs. Pulmonary:     Effort: Pulmonary effort is normal.     Comments: Clear to auscultation bilaterally.  No respiratory distress.  No evidence of tachypnea. Skin:    General: Skin is warm and dry.     Findings: No rash.  Neurological:     General: No focal deficit present.     Mental Status: He is alert.  Psychiatric:        Mood and Affect: Mood normal.        Behavior: Behavior normal.    ED Results / Procedures / Treatments   Labs (all labs ordered are listed, but only abnormal results are displayed) Labs Reviewed  RESP PANEL BY RT-PCR (FLU A&B, COVID) [id number]    EKG None  Radiology No results found.  Procedures Procedures    Medications Ordered in ED Medications  acetaminophen (TYLENOL) tablet 650 mg (650 mg Oral Given 07/16/21 1538)  benzonatate (TESSALON) capsule 100 mg (100 mg Oral Given 07/16/21 1538)    ED Course/ Medical Decision Making/ A&P  Medical Decision Making Risk OTC drugs. Prescription drug management.   This patient presents to the ED for concern of cough, watery discharge from his eyes, general malaise, myalgias, and nasal congestion. this involves an extensive number of treatment options, and is a complaint that carries with it a high risk of complications and morbidity.  The differential diagnosis includes viral URI, pneumonia.  I doubt medication interactions, strep throat, heart failure.  Patient has no significant comorbidities to impact his care today.  Old records were reviewed with no information pertinent to his care today.  I tried to look at her external records which were not available.    Lab Tests:  I Ordered, and personally interpreted labs.  The pertinent results include: Respiratory panel which was negative for COVID  and flu.   Imaging Studies ordered:  I considered ordering a chest x-ray but patient has good respirations and is in no respiratory distress at this time.  I do not hear any adventitious breath sounds.  Do not feel imaging is warranted at this time.   Cardiac Monitoring:  The patient was maintained on a cardiac monitor.  I personally viewed and interpreted the cardiac monitored which showed an underlying rhythm of: Normal sinus rhythm.  Patient's blood pressure noted to be elevated.  Does not have a history of high blood pressure.  He is also tachycardic.   Medicines ordered and prescription drug management:  I ordered medication including Tylenol for pain and myalgias.  I also ordered Tessalon Perle for cough.  Reevaluation of the patient after these medicines showed that the patient stayed the same I have reviewed the patients home medicines and have made adjustments as needed   Test Considered:  Strep PCR Chest x-ray   Problem List / ED Course:  Cough, myalgias likely viral URI Elevated blood pressure with no history of hypertension  Symptoms are likely consistent with a viral URI.  I have a low suspicion for pneumonia.  He does not have any clinical signs of fever although he does have mild tachycardia.  Respiratory panel was negative for COVID and flu. Tachycardia improved. I will likely prescribe him some Tessalon Perles and have him continue his at home regimen of DayQuil and NyQuil.  Patient does not have a primary care provider.  I will provide him with [facility] for follow-up and establishment of primary care.  With regard to his high blood pressure.  He does not have a history of high blood pressure.  Repeat blood pressure was 119/89.  Reevaluation:  After the interventions noted above, I reevaluated the patient and found that they have :stayed the same   Social Determinants of Health:  Patient does smoke 1 pack of cigarettes  daily.    Dispostion:  After consideration of the diagnostic results and the patients response to treatment, I feel that the patent would benefit from outpatient follow-up.   Final Clinical Impression(s) / ED Diagnoses Final diagnoses:  Acute cough    Rx / DC Orders ED Discharge Orders          Ordered    benzonatate (TESSALON) 100 MG capsule  Every 8 hours        07/16/21 1544              Honor Loh Str[place], [place] 07/16/21 1549    Charlynne Pander, MD 07/18/21 [phone]

## 2021-07-16 NOTE — ED Triage Notes (Signed)
Pt c/o flu like sx x 2 days-NAD-steady gait
